# Patient Record
Sex: Female | Born: 1945 | Race: White | Hispanic: No | Marital: Married
Health system: Southern US, Community
[De-identification: ages and names within clinical notes are randomized; demographics above are authoritative.]

---

## 2018-04-28 ENCOUNTER — Other Ambulatory Visit: Payer: Self-pay | Admitting: Internal Medicine

## 2018-04-28 DIAGNOSIS — Z1231 Encounter for screening mammogram for malignant neoplasm of breast: Secondary | ICD-10-CM

## 2018-05-22 ENCOUNTER — Encounter: Payer: Self-pay | Admitting: Radiology

## 2018-05-22 ENCOUNTER — Ambulatory Visit
Admission: RE | Admit: 2018-05-22 | Discharge: 2018-05-22 | Disposition: A | Discharge status: Home / Self Care | Payer: Medicare Other | Source: Ambulatory Visit | Attending: Internal Medicine | Admitting: Internal Medicine

## 2018-05-22 DIAGNOSIS — Z1231 Encounter for screening mammogram for malignant neoplasm of breast: Secondary | ICD-10-CM

## 2019-04-08 ENCOUNTER — Other Ambulatory Visit: Payer: Self-pay | Admitting: Internal Medicine

## 2019-04-08 DIAGNOSIS — Z1231 Encounter for screening mammogram for malignant neoplasm of breast: Secondary | ICD-10-CM

## 2019-04-16 ENCOUNTER — Ambulatory Visit: Payer: Medicare Other

## 2019-04-21 ENCOUNTER — Ambulatory Visit: Payer: Medicare Other

## 2019-04-24 ENCOUNTER — Ambulatory Visit: Payer: Medicare Other | Attending: Internal Medicine

## 2019-04-24 DIAGNOSIS — Z23 Encounter for immunization: Secondary | ICD-10-CM | POA: Insufficient documentation

## 2019-04-24 NOTE — Progress Notes (Signed)
   Covid-19 Vaccination Clinic  Name:  Stephanie Vasquez    MRN: [id number] DOB: 10/11/1945  04/24/2019  [name] was observed post Covid-19 immunization for 15 minutes without incidence. She was provided with Vaccine Information Sheet and instruction to access the V-Safe system.   [name] was instructed to call 911 with any severe reactions post vaccine: Marland Kitchen Difficulty breathing  . Swelling of your face and throat  . A fast heartbeat  . A bad rash all over your body  . Dizziness and weakness    Immunizations Administered    Name Date Dose VIS Date Route   Pfizer COVID-19 Vaccine 04/24/2019  2:56 PM 0.3 mL 02/27/2019 Intramuscular   Manufacturer: [employer], [employer]   Lot: [id number]   NDC: [address]-0

## 2019-05-19 ENCOUNTER — Ambulatory Visit: Payer: Medicare Other | Attending: Internal Medicine

## 2019-05-19 DIAGNOSIS — Z23 Encounter for immunization: Secondary | ICD-10-CM | POA: Insufficient documentation

## 2019-05-19 NOTE — Progress Notes (Signed)
   Covid-19 Vaccination Clinic  Name:  Stephanie Vasquez    MRN: [id number] DOB: 1945-06-19  05/19/2019  Stephanie Vasquez was observed post Covid-19 immunization for 15 minutes without incident. She was provided with Vaccine Information Sheet and instruction to access the V-Safe system.   Stephanie Vasquez was instructed to call 911 with any severe reactions post vaccine: Marland Kitchen Difficulty breathing  . Swelling of face and throat  . A fast heartbeat  . A bad rash all over body  . Dizziness and weakness   Immunizations Administered    Name Date Dose VIS Date Route   Pfizer COVID-19 Vaccine 05/19/2019  2:27 PM 0.3 mL 02/27/2019 Intramuscular   Manufacturer: [employer], [employer]   Lot: [id number]   NDC: [address]-0

## 2019-05-25 ENCOUNTER — Ambulatory Visit
Admission: RE | Admit: 2019-05-25 | Discharge: 2019-05-25 | Disposition: A | Discharge status: Home / Self Care | Payer: Medicare Other | Source: Ambulatory Visit | Attending: Internal Medicine | Admitting: Internal Medicine

## 2019-05-25 ENCOUNTER — Other Ambulatory Visit: Payer: Self-pay

## 2019-05-25 DIAGNOSIS — Z1231 Encounter for screening mammogram for malignant neoplasm of breast: Secondary | ICD-10-CM

## 2020-04-13 ENCOUNTER — Other Ambulatory Visit: Payer: Self-pay | Admitting: Internal Medicine

## 2020-04-13 DIAGNOSIS — Z1231 Encounter for screening mammogram for malignant neoplasm of breast: Secondary | ICD-10-CM

## 2020-05-26 ENCOUNTER — Other Ambulatory Visit: Payer: Self-pay

## 2020-05-26 ENCOUNTER — Ambulatory Visit
Admission: RE | Admit: 2020-05-26 | Discharge: 2020-05-26 | Disposition: A | Discharge status: Home / Self Care | Payer: Medicare Other | Source: Ambulatory Visit | Attending: Internal Medicine | Admitting: Internal Medicine

## 2020-05-26 DIAGNOSIS — Z1231 Encounter for screening mammogram for malignant neoplasm of breast: Secondary | ICD-10-CM

## 2021-02-24 ENCOUNTER — Other Ambulatory Visit: Payer: Self-pay | Admitting: Internal Medicine

## 2021-02-24 DIAGNOSIS — M8580 Other specified disorders of bone density and structure, unspecified site: Secondary | ICD-10-CM

## 2021-04-12 ENCOUNTER — Other Ambulatory Visit: Payer: Self-pay | Admitting: Internal Medicine

## 2021-04-12 DIAGNOSIS — Z1231 Encounter for screening mammogram for malignant neoplasm of breast: Secondary | ICD-10-CM

## 2021-05-29 ENCOUNTER — Ambulatory Visit: Payer: Medicare Other

## 2021-05-30 ENCOUNTER — Ambulatory Visit
Admission: RE | Admit: 2021-05-30 | Discharge: 2021-05-30 | Disposition: A | Discharge status: Home / Self Care | Payer: Medicare Other | Source: Ambulatory Visit | Attending: Internal Medicine | Admitting: Internal Medicine

## 2021-05-30 DIAGNOSIS — Z1231 Encounter for screening mammogram for malignant neoplasm of breast: Secondary | ICD-10-CM | POA: Diagnosis not present

## 2021-07-27 ENCOUNTER — Ambulatory Visit
Admission: RE | Admit: 2021-07-27 | Discharge: 2021-07-27 | Disposition: A | Discharge status: Home / Self Care | Payer: Medicare Other | Source: Ambulatory Visit | Attending: Internal Medicine | Admitting: Internal Medicine

## 2021-07-27 DIAGNOSIS — M8589 Other specified disorders of bone density and structure, multiple sites: Secondary | ICD-10-CM | POA: Diagnosis not present

## 2021-07-27 DIAGNOSIS — Z780 Asymptomatic menopausal state: Secondary | ICD-10-CM | POA: Diagnosis not present

## 2021-07-27 DIAGNOSIS — M8580 Other specified disorders of bone density and structure, unspecified site: Secondary | ICD-10-CM

## 2021-08-27 DIAGNOSIS — R0982 Postnasal drip: Secondary | ICD-10-CM | POA: Diagnosis not present

## 2021-08-27 DIAGNOSIS — U071 COVID-19: Secondary | ICD-10-CM | POA: Diagnosis not present

## 2021-08-27 DIAGNOSIS — Z20822 Contact with and (suspected) exposure to covid-19: Secondary | ICD-10-CM | POA: Diagnosis not present

## 2021-08-27 DIAGNOSIS — R059 Cough, unspecified: Secondary | ICD-10-CM | POA: Diagnosis not present

## 2021-09-18 DIAGNOSIS — M5459 Other low back pain: Secondary | ICD-10-CM | POA: Diagnosis not present

## 2021-09-18 DIAGNOSIS — M5451 Vertebrogenic low back pain: Secondary | ICD-10-CM | POA: Diagnosis not present

## 2021-09-27 DIAGNOSIS — M5459 Other low back pain: Secondary | ICD-10-CM | POA: Diagnosis not present

## 2021-10-06 DIAGNOSIS — M5451 Vertebrogenic low back pain: Secondary | ICD-10-CM | POA: Diagnosis not present

## 2021-11-02 DIAGNOSIS — H401131 Primary open-angle glaucoma, bilateral, mild stage: Secondary | ICD-10-CM | POA: Diagnosis not present

## 2021-11-17 DIAGNOSIS — M5451 Vertebrogenic low back pain: Secondary | ICD-10-CM | POA: Diagnosis not present

## 2022-02-14 ENCOUNTER — Ambulatory Visit (INDEPENDENT_AMBULATORY_CARE_PROVIDER_SITE_OTHER): Payer: Medicare Other | Admitting: Orthopaedic Surgery

## 2022-02-14 ENCOUNTER — Encounter: Payer: Self-pay | Admitting: Orthopaedic Surgery

## 2022-02-14 ENCOUNTER — Ambulatory Visit (INDEPENDENT_AMBULATORY_CARE_PROVIDER_SITE_OTHER): Payer: Medicare Other

## 2022-02-14 DIAGNOSIS — M25562 Pain in left knee: Secondary | ICD-10-CM

## 2022-02-14 DIAGNOSIS — G8929 Other chronic pain: Secondary | ICD-10-CM

## 2022-02-14 MED ORDER — METHYLPREDNISOLONE ACETATE 40 MG/ML IJ SUSP
40.0000 mg | INTRAMUSCULAR | Status: AC | PRN
  Administered 2022-02-14: 40 mg via INTRA_ARTICULAR

## 2022-02-14 MED ORDER — LIDOCAINE HCL 1 % IJ SOLN
3.0000 mL | INTRAMUSCULAR | Status: AC | PRN
  Administered 2022-02-14: 3 mL

## 2022-02-14 NOTE — Progress Notes (Signed)
The patient is someone I have not seen in a long period of time but I know her children will well and her grandkids.  I actually injected her left knee years ago.  She is been having some pain in her left knee for about 6 weeks now with no known injury.  She is an active 76 year old female.  She denies any locking catching with her knee but is been hurting with some activities as well.  She said no acute change in her medical status.  She is thin and not a diabetic.  Examination of her left knee shows just a trace effusion.  Her range of motion is full and the knee is ligamentously stable.  There is some global tenderness.  2 views of her left knee show only mild arthritic changes.  I recommended a steroid injection in her left knee today which she agreed to and tolerated well.  I think that is the best treatment for now.  She may be a good candidate for hyaluronic acid at some point.  She will let us know.  All question concerns were answered and addressed.       Procedure Note  Patient: Stephanie Vasquez             Date of Birth: 1946/03/17           MRN: [id number]             Visit Date: 02/14/2022  Procedures: Visit Diagnoses:  1. Chronic pain of left knee     Large Joint Inj: L knee on 02/14/2022 4:16 PM Indications: diagnostic evaluation and pain Details: 22 G 1.5 in needle, superolateral approach  Arthrogram: No  Medications: 3 mL lidocaine 1 %; 40 mg methylPREDNISolone acetate 40 MG/ML Outcome: tolerated well, no immediate complications Procedure, treatment alternatives, risks and benefits explained, specific risks discussed. Consent was given by the patient. Immediately prior to procedure a time out was called to verify the correct patient, procedure, equipment, support staff and site/side marked as required. Patient was prepped and draped in the usual sterile fashion.

## 2022-03-28 ENCOUNTER — Other Ambulatory Visit: Payer: Self-pay | Admitting: Internal Medicine

## 2022-03-28 DIAGNOSIS — Z79899 Other long term (current) drug therapy: Secondary | ICD-10-CM | POA: Diagnosis not present

## 2022-03-28 DIAGNOSIS — M8588 Other specified disorders of bone density and structure, other site: Secondary | ICD-10-CM | POA: Diagnosis not present

## 2022-03-28 DIAGNOSIS — Z0000 Encounter for general adult medical examination without abnormal findings: Secondary | ICD-10-CM | POA: Diagnosis not present

## 2022-03-28 DIAGNOSIS — M8000XD Age-related osteoporosis with current pathological fracture, unspecified site, subsequent encounter for fracture with routine healing: Secondary | ICD-10-CM | POA: Diagnosis not present

## 2022-03-28 DIAGNOSIS — E785 Hyperlipidemia, unspecified: Secondary | ICD-10-CM | POA: Diagnosis not present

## 2022-04-16 ENCOUNTER — Other Ambulatory Visit: Payer: Self-pay | Admitting: Internal Medicine

## 2022-04-16 DIAGNOSIS — Z1231 Encounter for screening mammogram for malignant neoplasm of breast: Secondary | ICD-10-CM

## 2022-05-04 ENCOUNTER — Ambulatory Visit
Admission: RE | Admit: 2022-05-04 | Discharge: 2022-05-04 | Disposition: A | Discharge status: Home / Self Care | Payer: Medicare Other | Source: Ambulatory Visit | Attending: Internal Medicine | Admitting: Internal Medicine

## 2022-05-04 DIAGNOSIS — E785 Hyperlipidemia, unspecified: Secondary | ICD-10-CM

## 2022-05-09 DIAGNOSIS — H527 Unspecified disorder of refraction: Secondary | ICD-10-CM | POA: Diagnosis not present

## 2022-06-06 ENCOUNTER — Ambulatory Visit
Admission: RE | Admit: 2022-06-06 | Discharge: 2022-06-06 | Disposition: A | Discharge status: Home / Self Care | Payer: Medicare Other | Source: Ambulatory Visit | Attending: Internal Medicine | Admitting: Internal Medicine

## 2022-06-06 DIAGNOSIS — Z1231 Encounter for screening mammogram for malignant neoplasm of breast: Secondary | ICD-10-CM | POA: Diagnosis not present

## 2022-12-09 IMAGING — MG MM DIGITAL SCREENING BILAT W/ TOMO AND CAD
8 series · 8 of 24 positions shown · non-contrast
Comparison: Previous exam(s).

CLINICAL DATA: Screening.

EXAM:
DIGITAL SCREENING BILATERAL MAMMOGRAM WITH TOMOSYNTHESIS AND CAD
TECHNIQUE: Bilateral screening digital craniocaudal and mediolateral oblique
mammograms were obtained. Bilateral screening digital breast
tomosynthesis was performed. The images were evaluated with
computer-aided detection.

[R MLO synth-2D]
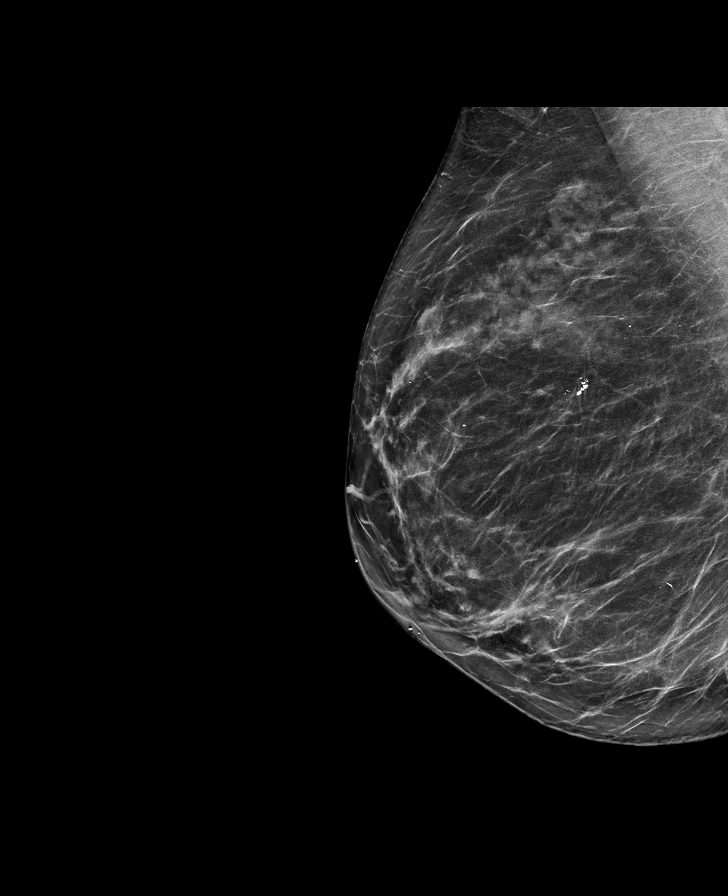

[L MLO synth-2D]
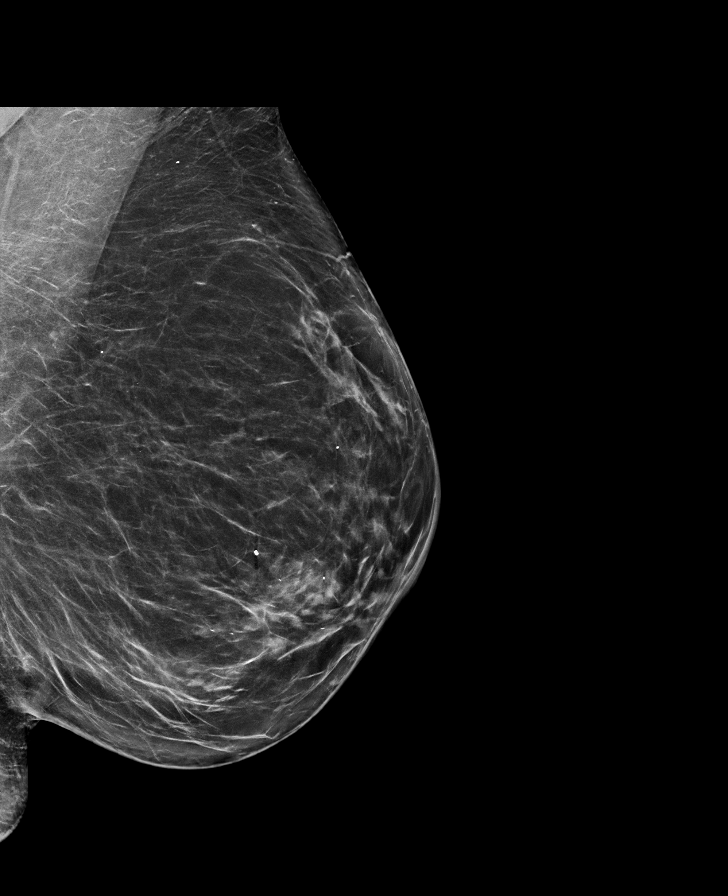

[R CC synth-2D]
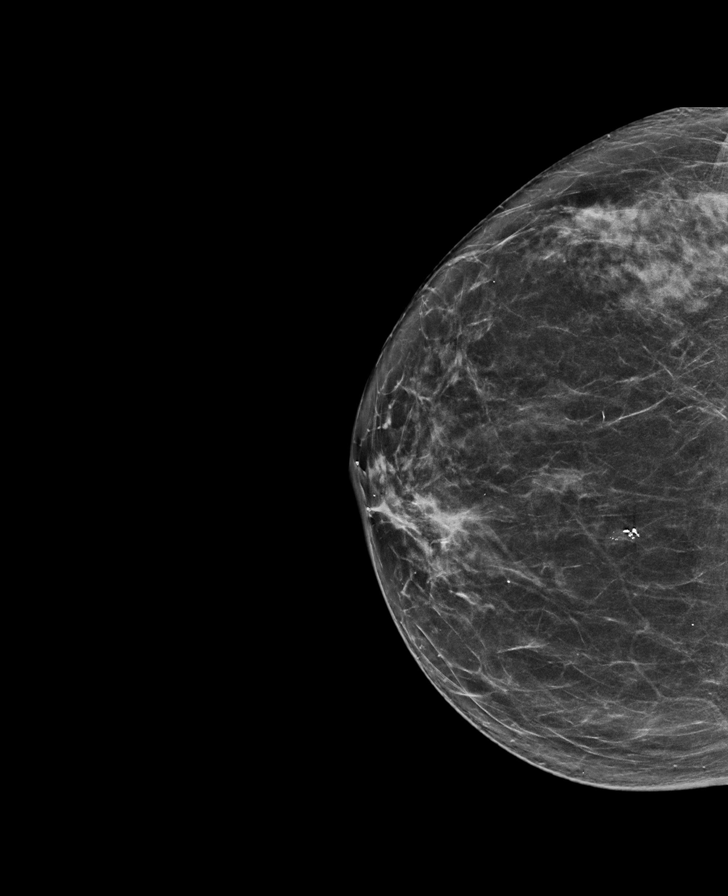

[L CC synth-2D]
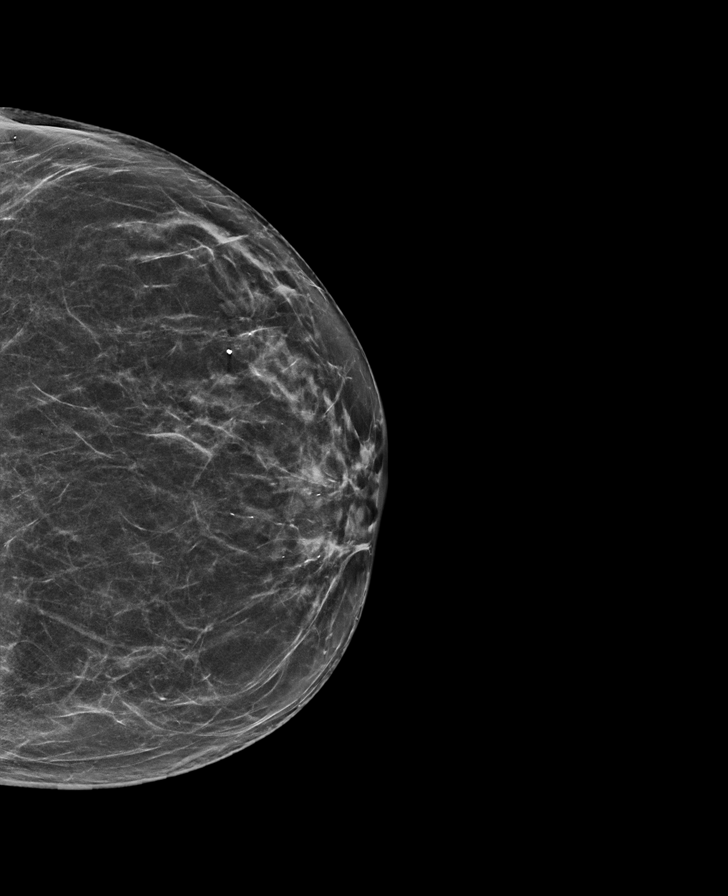

[R MLO tomo · tomo slice 39/77.0]
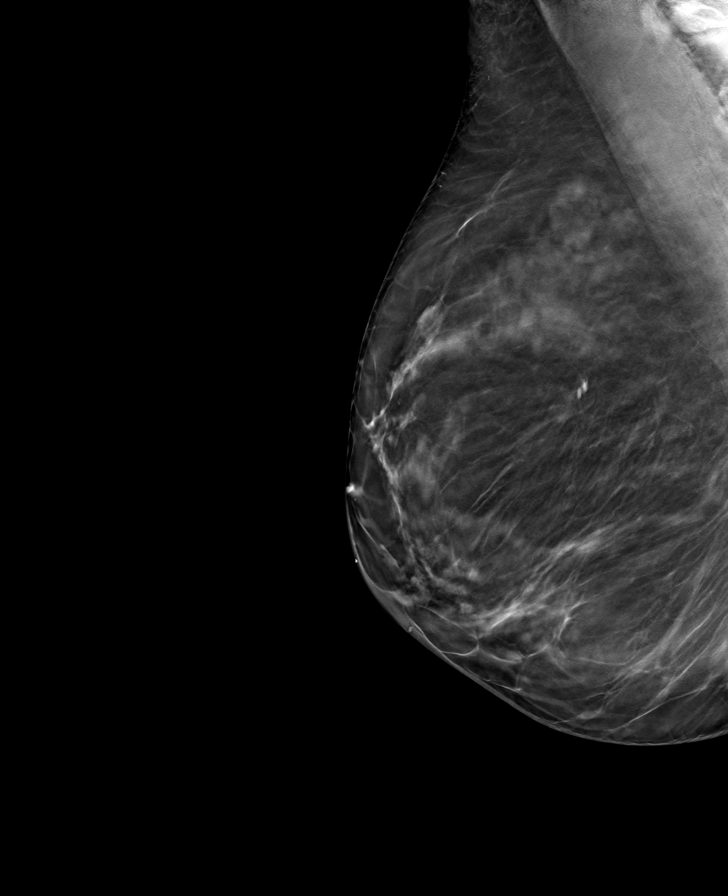

[R CC tomo · tomo slice 36/71.0]
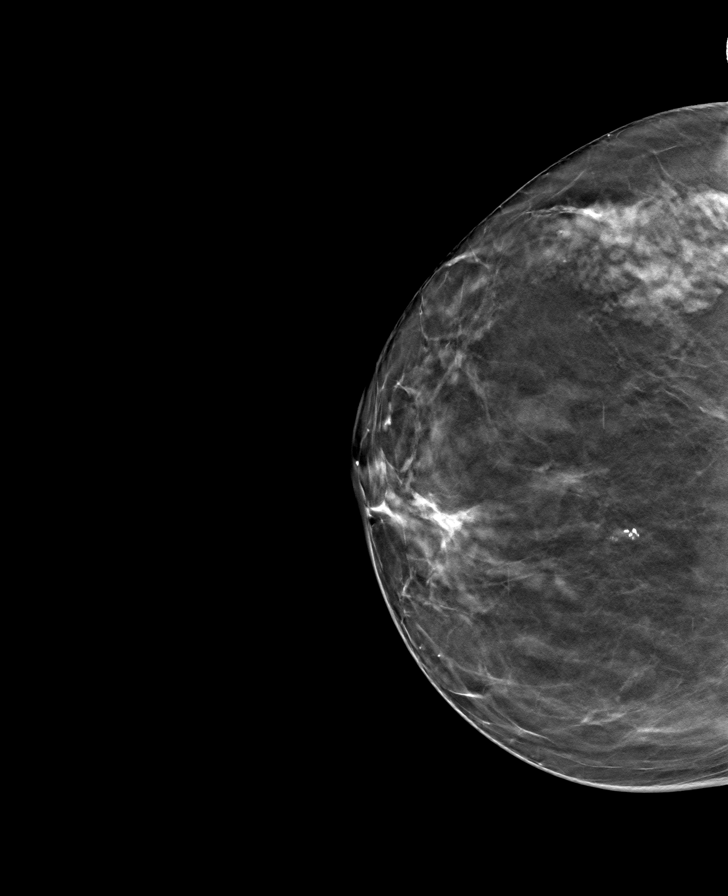

[L CC tomo · tomo slice 35/69.0]
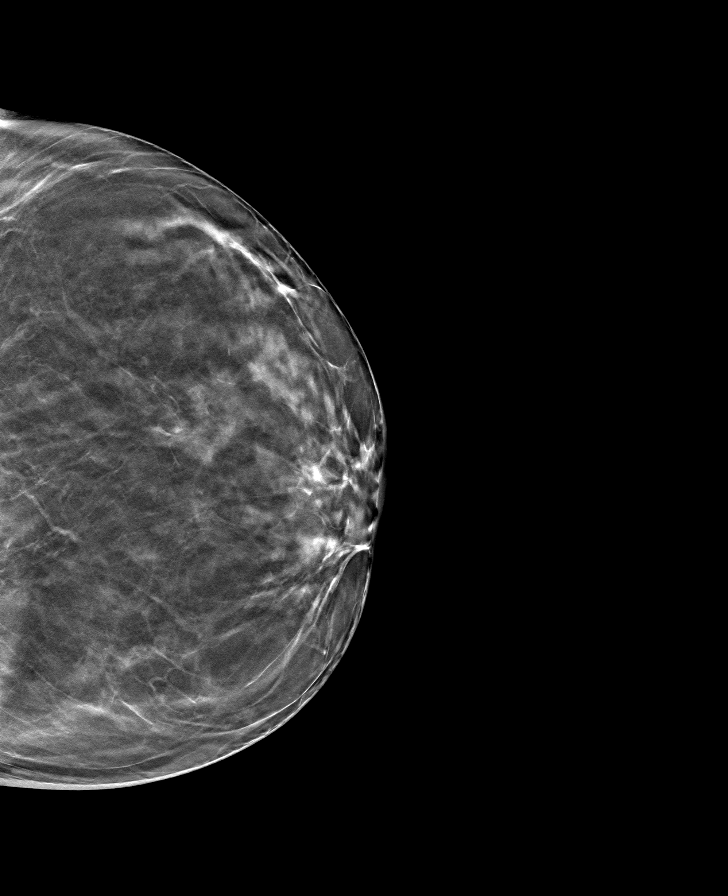

[L MLO tomo · tomo slice 41/80.0]
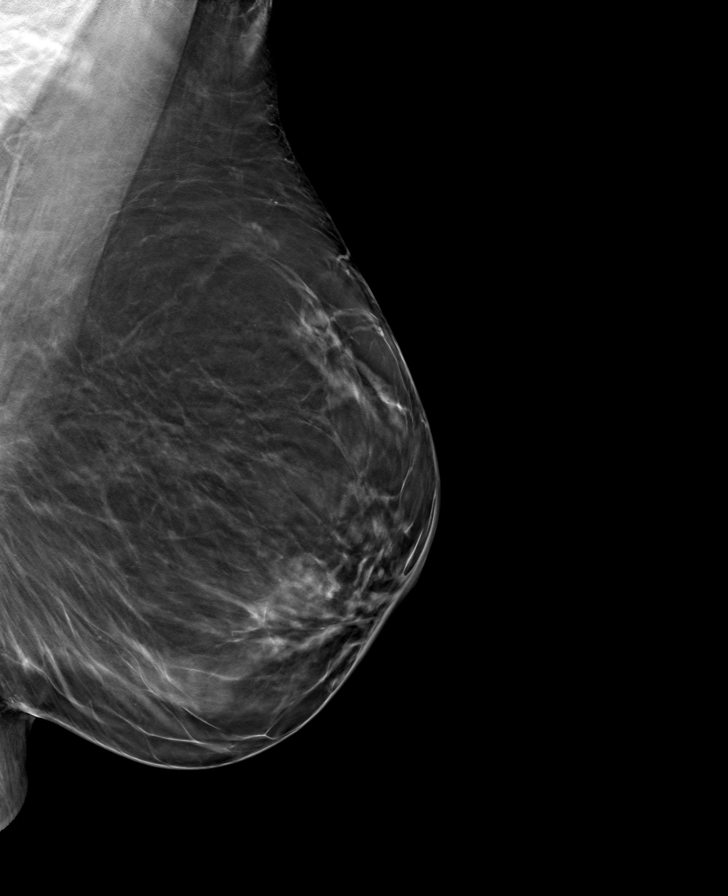

[8 of 24 positions shown; findings below may reference images not displayed]

ACR Breast Density Category b: There are scattered areas of
fibroglandular density.
FINDINGS: There are no findings suspicious for malignancy.
IMPRESSION: No mammographic evidence of malignancy. A result letter of this
screening mammogram will be mailed directly to the patient.

RECOMMENDATION:
Screening mammogram in one year. (Code:51-O-LD2)

BI-RADS CATEGORY  1: Negative.

## 2023-03-25 ENCOUNTER — Ambulatory Visit: Payer: Medicare Other | Admitting: Physician Assistant

## 2023-04-01 DIAGNOSIS — M8589 Other specified disorders of bone density and structure, multiple sites: Secondary | ICD-10-CM | POA: Diagnosis not present

## 2023-04-01 DIAGNOSIS — E785 Hyperlipidemia, unspecified: Secondary | ICD-10-CM | POA: Diagnosis not present

## 2023-04-01 DIAGNOSIS — S22000D Wedge compression fracture of unspecified thoracic vertebra, subsequent encounter for fracture with routine healing: Secondary | ICD-10-CM | POA: Diagnosis not present

## 2023-04-01 DIAGNOSIS — Z0000 Encounter for general adult medical examination without abnormal findings: Secondary | ICD-10-CM | POA: Diagnosis not present

## 2023-04-10 ENCOUNTER — Ambulatory Visit: Payer: Medicare Other | Admitting: Orthopaedic Surgery

## 2023-04-10 DIAGNOSIS — G8929 Other chronic pain: Secondary | ICD-10-CM | POA: Diagnosis not present

## 2023-04-10 DIAGNOSIS — M25562 Pain in left knee: Secondary | ICD-10-CM | POA: Diagnosis not present

## 2023-04-10 MED ORDER — METHYLPREDNISOLONE ACETATE 40 MG/ML IJ SUSP
40.0000 mg | INTRAMUSCULAR | Status: AC | PRN
  Administered 2023-04-10: 40 mg via INTRA_ARTICULAR

## 2023-04-10 MED ORDER — LIDOCAINE HCL 1 % IJ SOLN
3.0000 mL | INTRAMUSCULAR | Status: AC | PRN
  Administered 2023-04-10: 3 mL

## 2023-04-10 NOTE — Progress Notes (Signed)
[name] comes in today for evaluation of left knee pain.  It feels much better but over the Christmas holiday she was having problems going up and down stairs and some type of catching or popping it happened on the lateral aspect of her left knee.  She says it feels better now but it does hurt off and on.  Is not constant.  She does not walk with assist device.  In 2023 we did x-rays of her left knee showing well-maintained joint space.  She does use a treadmill at home as well as an exercise bike when it is cold weather.  If not her and her husband walk outside.  I know him very well with.  Examination of her left knee shows no malalignment.  Range of motion is full.  There are some slight patellofemoral capitation.  There is no effusion.  Her knee is ligamentously stable.  She did request a steroid injection in her knee today and I think this is reasonable.  I did show her quad strength exercises that I want her to try twice daily with 2 sets on each thigh in the morning and 2 in the evening.  This will strengthen her knees.  She demonstrated these exercises back to me as well.  She did tolerate the steroid injection well.  She knows to contact us if her knee is not getting any better.    Procedure Note  Patient: Stephanie Vasquez             Date of Birth: [birth year]           MRN: [id number]             Visit Date: 04/10/2023  Procedures: Visit Diagnoses:  1. Chronic pain of left knee     Large Joint Inj: L knee on 04/10/2023 9:20 AM Indications: diagnostic evaluation and pain Details: 22 G 1.5 in needle, superolateral approach  Arthrogram: No  Medications: 3 mL lidocaine 1 %; 40 mg methylPREDNISolone acetate 40 MG/ML Outcome: tolerated well, no immediate complications Procedure, treatment alternatives, risks and benefits explained, specific risks discussed. Consent was given by the patient. Immediately prior to procedure a time out was called to verify the correct patient, procedure,  equipment, support staff and site/side marked as required. Patient was prepped and draped in the usual sterile fashion.

## 2023-04-23 ENCOUNTER — Other Ambulatory Visit: Payer: Self-pay | Admitting: Internal Medicine

## 2023-04-23 DIAGNOSIS — Z0000 Encounter for general adult medical examination without abnormal findings: Secondary | ICD-10-CM

## 2023-05-06 DIAGNOSIS — H401131 Primary open-angle glaucoma, bilateral, mild stage: Secondary | ICD-10-CM | POA: Diagnosis not present

## 2023-06-07 ENCOUNTER — Ambulatory Visit
Admission: RE | Admit: 2023-06-07 | Discharge: 2023-06-07 | Disposition: A | Discharge status: Home / Self Care | Payer: Medicare Other | Source: Ambulatory Visit | Attending: Internal Medicine | Admitting: Internal Medicine

## 2023-06-07 DIAGNOSIS — Z0000 Encounter for general adult medical examination without abnormal findings: Secondary | ICD-10-CM

## 2023-06-07 DIAGNOSIS — Z1231 Encounter for screening mammogram for malignant neoplasm of breast: Secondary | ICD-10-CM | POA: Diagnosis not present

## 2023-08-13 DIAGNOSIS — H401131 Primary open-angle glaucoma, bilateral, mild stage: Secondary | ICD-10-CM | POA: Diagnosis not present

## 2023-11-28 DIAGNOSIS — H401131 Primary open-angle glaucoma, bilateral, mild stage: Secondary | ICD-10-CM | POA: Diagnosis not present

## 2024-01-20 ENCOUNTER — Encounter: Payer: Self-pay | Admitting: Radiology

## 2024-04-02 ENCOUNTER — Other Ambulatory Visit: Payer: Self-pay | Admitting: Internal Medicine

## 2024-04-02 DIAGNOSIS — Z1231 Encounter for screening mammogram for malignant neoplasm of breast: Secondary | ICD-10-CM

## 2024-06-08 ENCOUNTER — Ambulatory Visit
